# Patient Record
Sex: Female | Born: 1985 | Race: Black or African American | Hispanic: No | Marital: Single | Smoking: Current some day smoker
Health system: Southern US, Community
[De-identification: ages and names within clinical notes are randomized; demographics above are authoritative.]

## PROBLEM LIST (undated history)

## (undated) DIAGNOSIS — Z889 Allergy status to unspecified drugs, medicaments and biological substances status: Secondary | ICD-10-CM

## (undated) DIAGNOSIS — J45909 Unspecified asthma, uncomplicated: Secondary | ICD-10-CM

## (undated) HISTORY — PX: RECONSTRUCTION THUMB W/ TOE: SUR1092

## (undated) HISTORY — PX: OTHER SURGICAL HISTORY: SHX169

---

## 2008-01-06 ENCOUNTER — Emergency Department (HOSPITAL_COMMUNITY): Admission: EM | Admit: 2008-01-06 | Discharge: 2008-01-06 | Payer: Self-pay | Admitting: Emergency Medicine

## 2008-02-28 ENCOUNTER — Emergency Department (HOSPITAL_COMMUNITY): Admission: EM | Admit: 2008-02-28 | Discharge: 2008-02-28 | Payer: Self-pay | Admitting: Emergency Medicine

## 2012-06-24 ENCOUNTER — Emergency Department (HOSPITAL_BASED_OUTPATIENT_CLINIC_OR_DEPARTMENT_OTHER): Payer: Self-pay

## 2012-06-24 ENCOUNTER — Emergency Department (HOSPITAL_BASED_OUTPATIENT_CLINIC_OR_DEPARTMENT_OTHER)
Admission: EM | Admit: 2012-06-24 | Discharge: 2012-06-24 | Discharge status: Against Medical Advice | Payer: Self-pay | Attending: Emergency Medicine | Admitting: Emergency Medicine

## 2012-06-24 ENCOUNTER — Encounter (HOSPITAL_BASED_OUTPATIENT_CLINIC_OR_DEPARTMENT_OTHER): Payer: Self-pay | Admitting: *Deleted

## 2012-06-24 ENCOUNTER — Emergency Department (HOSPITAL_BASED_OUTPATIENT_CLINIC_OR_DEPARTMENT_OTHER)
Admission: EM | Admit: 2012-06-24 | Discharge: 2012-06-24 | Disposition: A | Discharge status: Home / Self Care | Payer: Self-pay | Attending: Emergency Medicine | Admitting: Emergency Medicine

## 2012-06-24 DIAGNOSIS — Z79899 Other long term (current) drug therapy: Secondary | ICD-10-CM | POA: Insufficient documentation

## 2012-06-24 DIAGNOSIS — J45909 Unspecified asthma, uncomplicated: Secondary | ICD-10-CM | POA: Insufficient documentation

## 2012-06-24 DIAGNOSIS — S6390XA Sprain of unspecified part of unspecified wrist and hand, initial encounter: Secondary | ICD-10-CM | POA: Insufficient documentation

## 2012-06-24 DIAGNOSIS — F17200 Nicotine dependence, unspecified, uncomplicated: Secondary | ICD-10-CM | POA: Insufficient documentation

## 2012-06-24 DIAGNOSIS — M79609 Pain in unspecified limb: Secondary | ICD-10-CM | POA: Insufficient documentation

## 2012-06-24 DIAGNOSIS — Y939 Activity, unspecified: Secondary | ICD-10-CM | POA: Insufficient documentation

## 2012-06-24 DIAGNOSIS — Y929 Unspecified place or not applicable: Secondary | ICD-10-CM | POA: Insufficient documentation

## 2012-06-24 DIAGNOSIS — X58XXXA Exposure to other specified factors, initial encounter: Secondary | ICD-10-CM | POA: Insufficient documentation

## 2012-06-24 HISTORY — DX: Allergy status to unspecified drugs, medicaments and biological substances: Z88.9

## 2012-06-24 HISTORY — DX: Unspecified asthma, uncomplicated: J45.909

## 2012-06-24 MED ORDER — ASPIRIN 81 MG PO CHEW: 81.0000 mg | CHEWABLE_TABLET | Freq: Every day | ORAL | Status: AC

## 2012-06-24 MED ORDER — IBUPROFEN 200 MG PO TABS
600.0000 mg | ORAL_TABLET | Freq: Once | ORAL | Status: AC
  Administered 2012-06-24: 600 mg via ORAL
  Filled 2012-06-24: qty 1

## 2012-06-24 MED ORDER — IBUPROFEN 400 MG PO TABS: 400.0000 mg | ORAL_TABLET | Freq: Four times a day (QID) | ORAL | Status: AC | PRN

## 2012-06-24 MED ORDER — ACETAMINOPHEN-CODEINE #2 300-15 MG PO TABS: 1.0000 | ORAL_TABLET | ORAL | Status: AC | PRN

## 2012-06-24 NOTE — ED Notes (Signed)
Left middle finger pain x 1 day- denies injury

## 2012-06-24 NOTE — ED Notes (Signed)
Pt approached nursing station and sts she must leave since she is "still on the clock at work and they need me to come back"

## 2012-06-24 NOTE — ED Notes (Signed)
Pt report her left middle finger hurts when she tries to straighten it since waking this morning- denies known injury

## 2012-06-24 NOTE — ED Provider Notes (Signed)
History    This chart was scribed for Danielle Kaplan, MD by Donne Anon, ED Scribe. This patient was seen in room MH06/MH06 and the patient's care was started at 1717.   CSN: [id number]  Arrival date & time 06/24/12  1707   First MD Initiated Contact with Patient 06/24/12 1717      Chief Complaint  Patient presents with  . Hand Pain     The history is provided by the patient. No language interpreter was used.   Danielle Glover is a 27 y.o. female who presents to the Emergency Department complaining of gradual onset, gradually worsening, constant moderate left middle finger pain which began when she woke up this morning. She reports that the pain is worse with movement. She reports associated swelling and difficulty moving her finger. She denies any recent trauma or any other pain. She reports this afternoon her fingertips went blue in her left hand, but it only lasted a few minutes and has not happened since.   Past Medical History  Diagnosis Date  . Asthma   . Multiple allergies     History reviewed. No pertinent past surgical history.  No family history on file.  History  Substance Use Topics  . Smoking status: Current Some Day Smoker    Types: Cigars  . Smokeless tobacco: Never Used  . Alcohol Use: No    OB History   Grav Para Term Preterm Abortions TAB SAB Ect Mult Living                  Review of Systems  Constitutional: Negative for fever.  Musculoskeletal: Positive for joint swelling and arthralgias.  All other systems reviewed and are negative.    Allergies  Review of patient's allergies indicates no known allergies.  Home Medications   Current Outpatient Rx  Name  Route  Sig  Dispense  Refill  . albuterol (PROVENTIL HFA;VENTOLIN HFA) 108 (90 BASE) MCG/ACT inhaler   Inhalation   Inhale 2 puffs into the lungs every 6 (six) hours as needed for wheezing.         . Diphenhydramine-PE-APAP (ALLERGY & SINUS INTENSE ST PO)   Oral   Take by mouth.         . fluticasone (FLOVENT HFA) 110 MCG/ACT inhaler   Inhalation   Inhale 1 puff into the lungs 2 (two) times daily.           BP 135/87  Pulse 93  Temp(Src) 99.9 F (37.7 C) (Oral)  Resp 18  SpO2 100%  Physical Exam  Nursing note and vitals reviewed. Constitutional: She is oriented to person, place, and time. She appears well-developed and well-nourished. No distress.  HENT:  Head: Normocephalic and atraumatic.  Eyes: EOM are normal.  Neck: Neck supple. No tracheal deviation present.  Cardiovascular: Normal rate, regular rhythm and normal heart sounds.   Pulmonary/Chest: Effort normal and breath sounds normal. No respiratory distress.  Musculoskeletal: Normal range of motion.  Grossly no swelling appreciated except long finger. Mild swelling on left side compared to right side. Tenderness over entire digit, worse over PIP joint.   Neurological: She is alert and oriented to person, place, and time.  Skin: Skin is warm and dry.  Psychiatric: She has a normal mood and affect. Her behavior is normal.    ED Course  Procedures (including critical care time) DIAGNOSTIC STUDIES: Oxygen Saturation is 100% on room air, normal by my interpretation.    COORDINATION OF CARE: 5:48 PM Discussed treatment  plan which includes left hand x-ray with pt at bedside and pt agreed to plan.     Labs Reviewed - No data to display No results found.   No diagnosis found.    MDM  I personally performed the services described in this documentation, which was scribed in my presence. The recorded information has been reviewed and is accurate.  Pt comes in with cc of hand injury. Woke up with the middle finger pain - the entire finger hurts, but  The pain is worse over the PIP. ROM is compromised due to pain. There is slight swelling noted over the PIP - otherwise, the exam is not suggestive of infection.  Pt also had this one episode of her finger tips turning purple. She has no risk  factors for embolic dz, no blood dyscrasias, and she is not a smoker. Will give ASA and patient requested to see PCP if repeat symptoms - which appear raynauds type phenomenon.          Danielle Kaplan, MD 06/24/12 [phone]

## 2014-04-05 IMAGING — CR DG FINGER MIDDLE 2+V*L*
3 series · 3 of 3 positions shown · non-contrast
Comparison: None

CLINICAL DATA: Left middle finger pain.

LEFT MIDDLE FINGER 2+V

[x finger pa left]
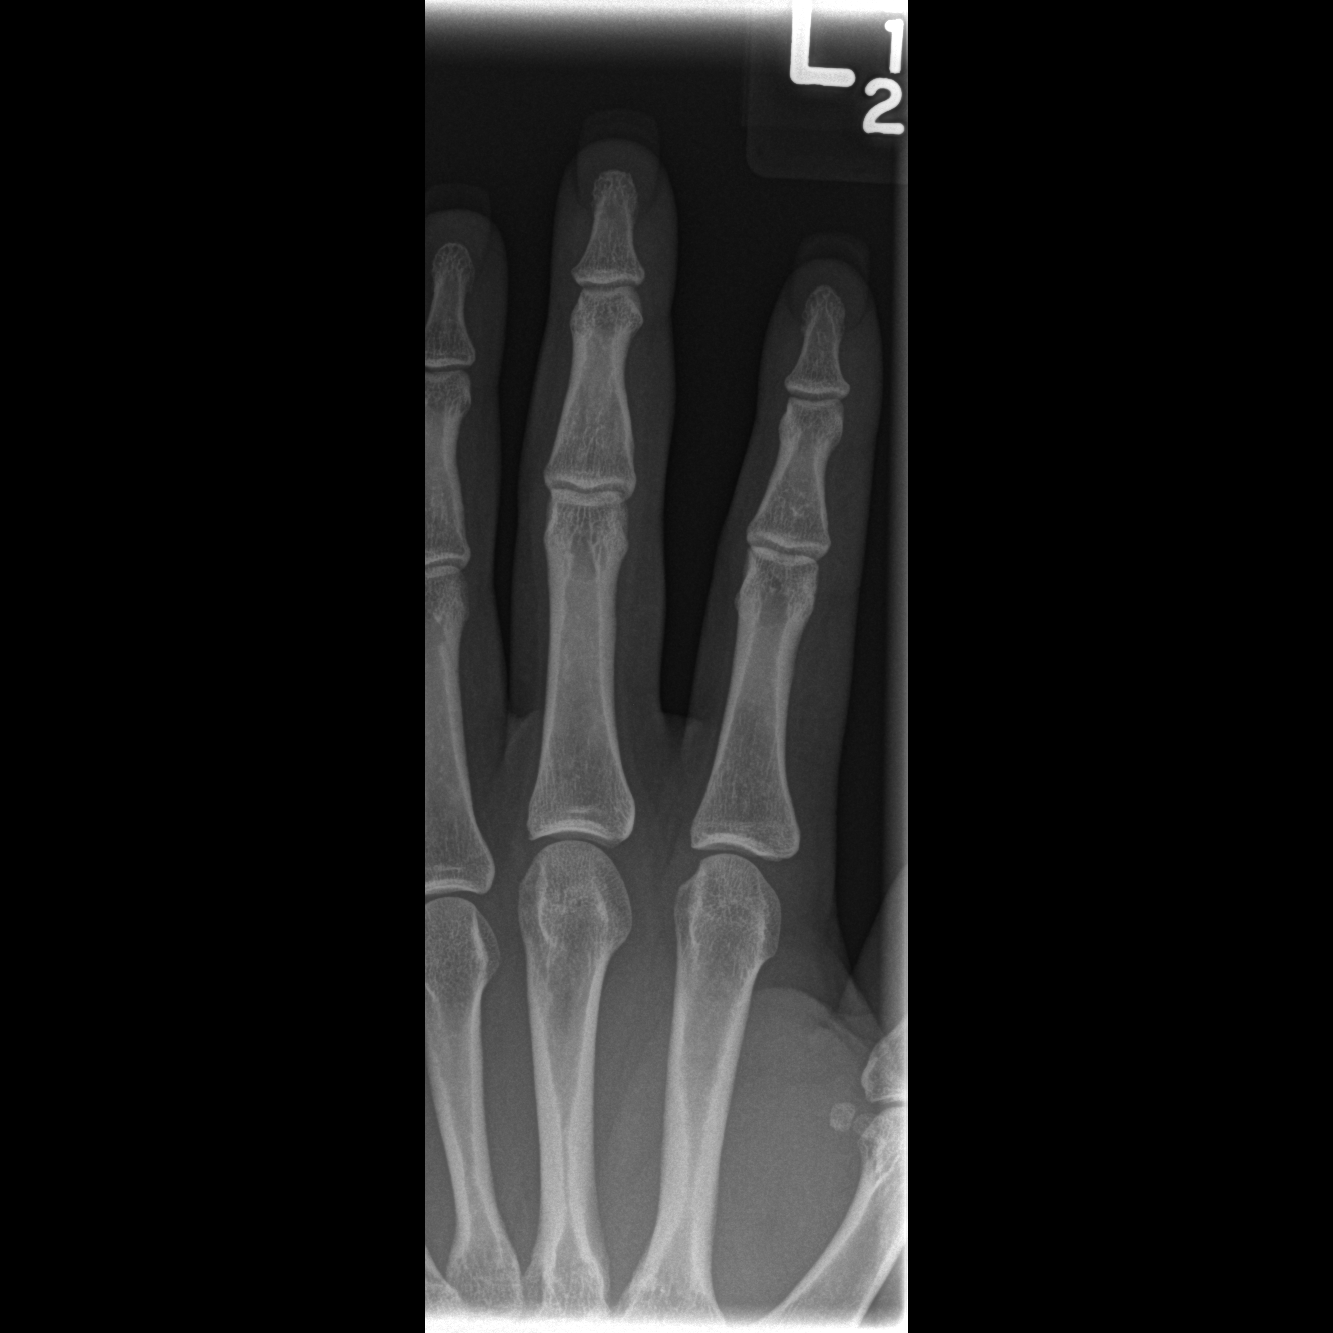

[x finger obl. left]
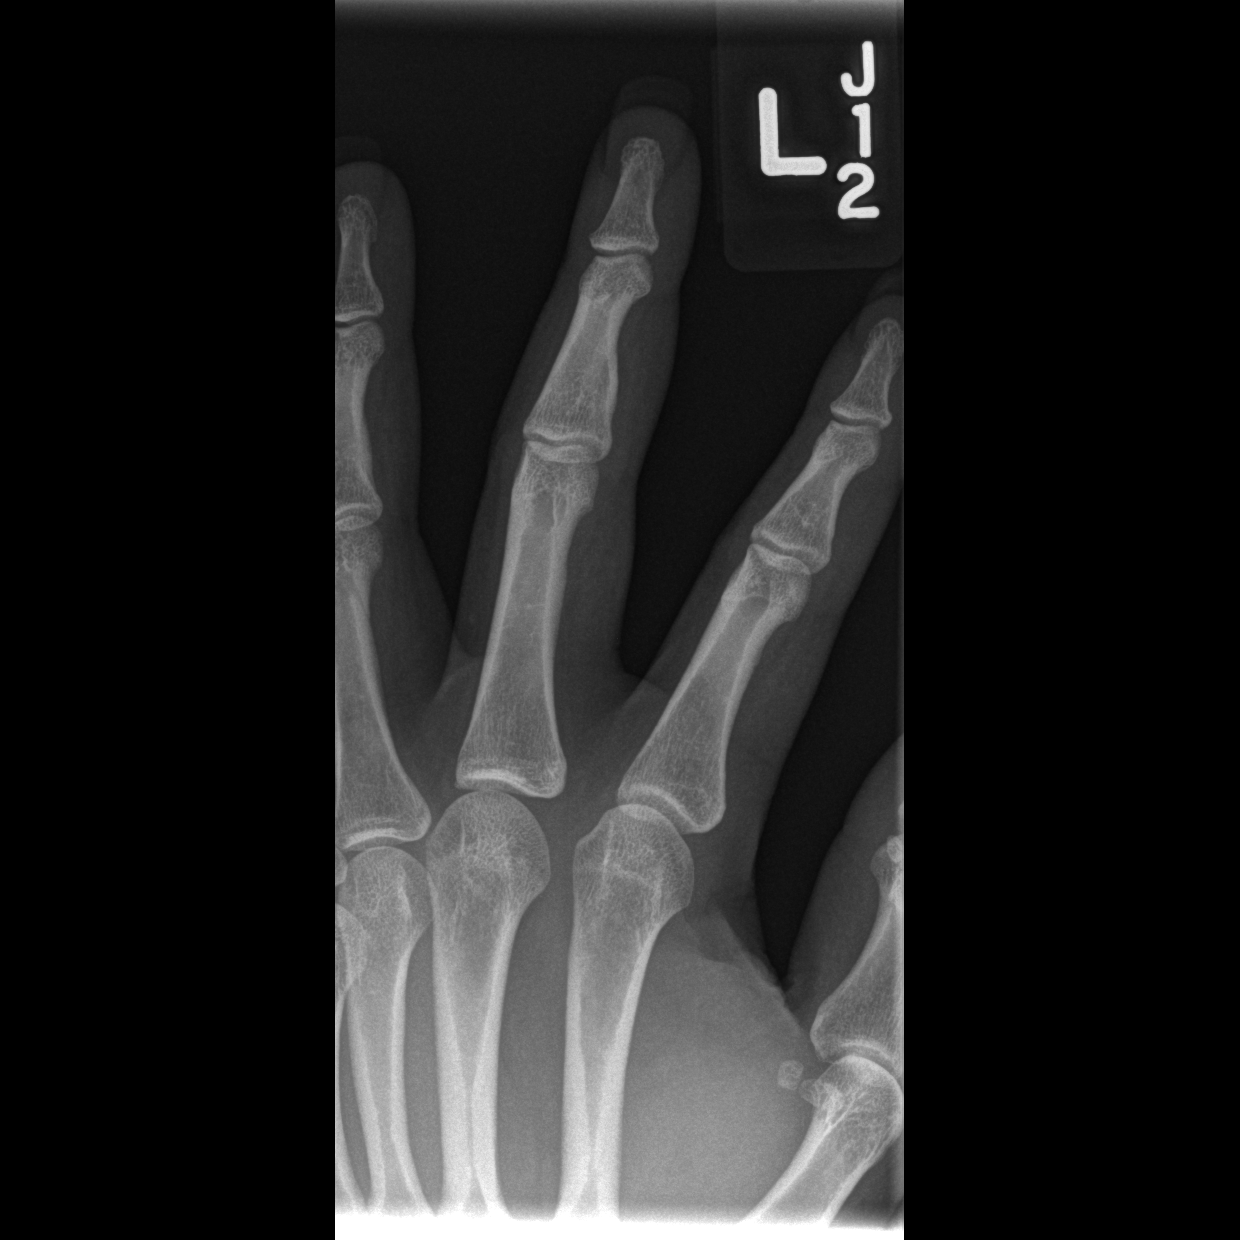

[x finger lateral left]
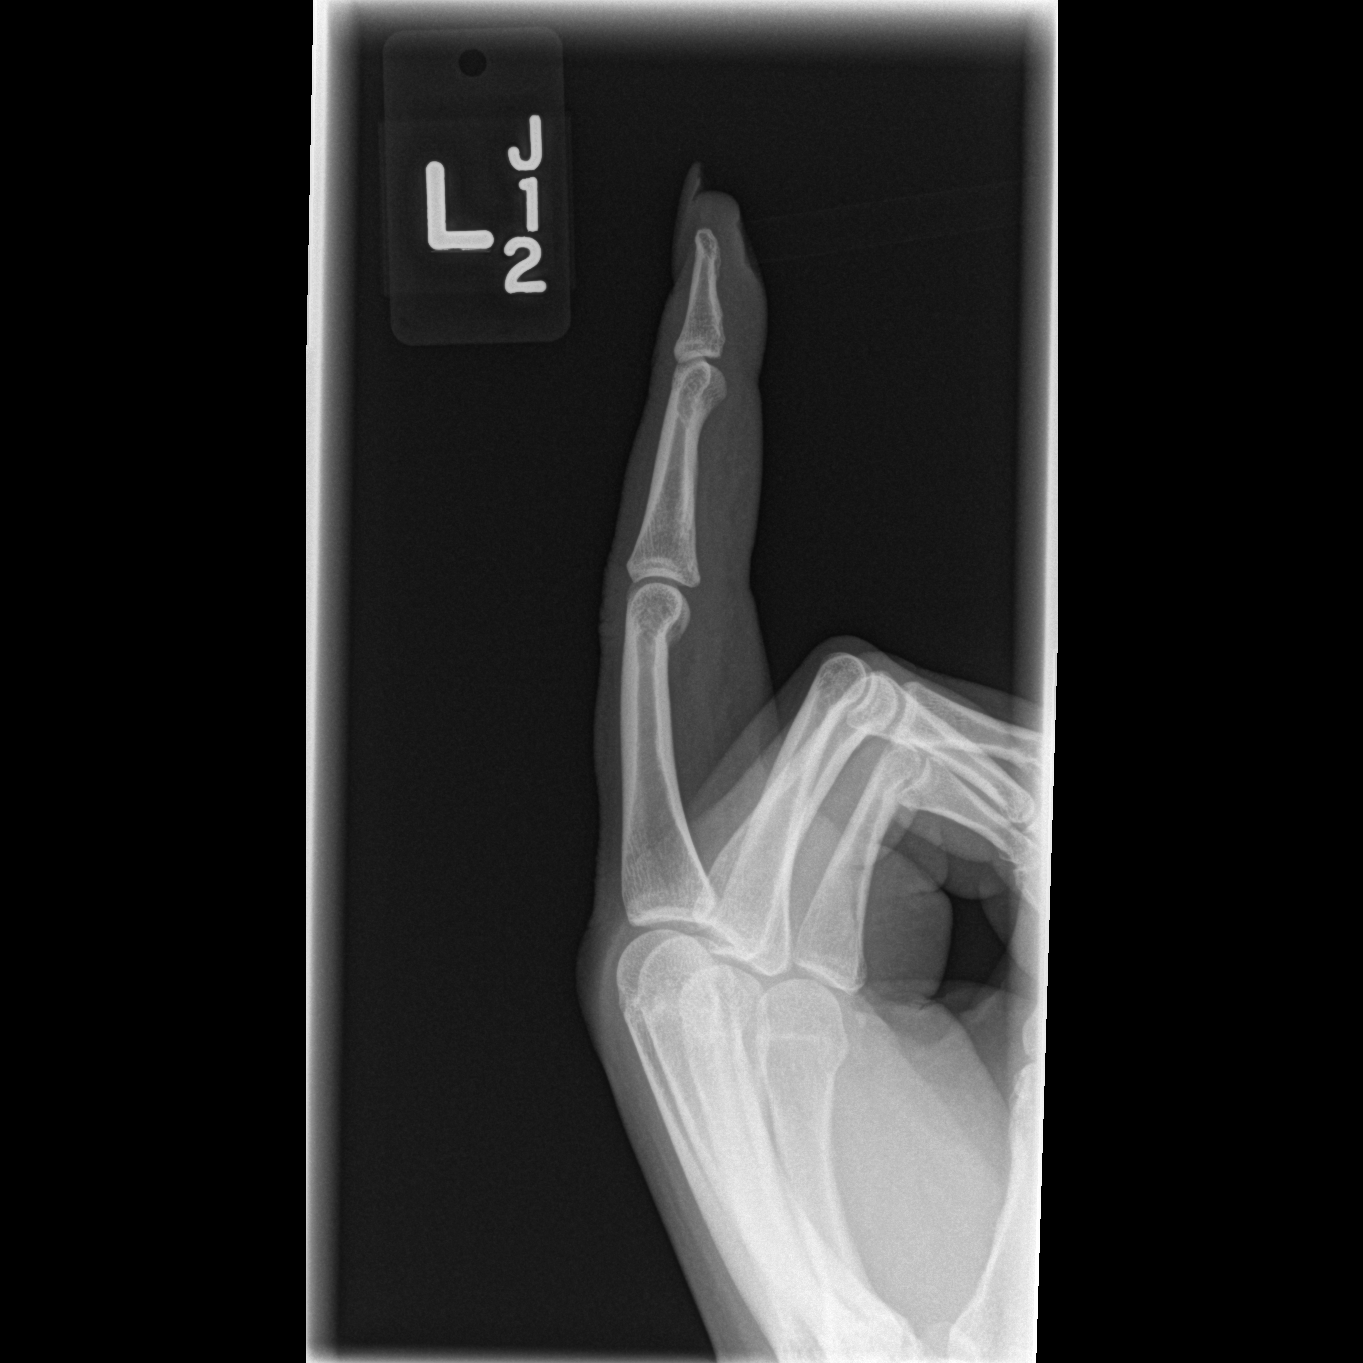

[3 of 3 positions shown; findings below may reference images not displayed]

FINDINGS: No evidence of acute fracture, subluxation or dislocation
identified.

No radio-opaque foreign bodies are present.

No focal bony lesions are noted.

The joint spaces are unremarkable.
IMPRESSION: Unremarkable exam.

## 2017-09-30 ENCOUNTER — Encounter (HOSPITAL_BASED_OUTPATIENT_CLINIC_OR_DEPARTMENT_OTHER): Payer: Self-pay | Admitting: Emergency Medicine

## 2017-09-30 ENCOUNTER — Emergency Department (HOSPITAL_BASED_OUTPATIENT_CLINIC_OR_DEPARTMENT_OTHER)
Admission: EM | Admit: 2017-09-30 | Discharge: 2017-09-30 | Disposition: A | Discharge status: Home / Self Care | Payer: 59 | Attending: Emergency Medicine | Admitting: Emergency Medicine

## 2017-09-30 ENCOUNTER — Other Ambulatory Visit: Payer: Self-pay

## 2017-09-30 DIAGNOSIS — M5489 Other dorsalgia: Secondary | ICD-10-CM | POA: Diagnosis not present

## 2017-09-30 DIAGNOSIS — Z5321 Procedure and treatment not carried out due to patient leaving prior to being seen by health care provider: Secondary | ICD-10-CM | POA: Diagnosis not present

## 2017-09-30 NOTE — ED Triage Notes (Addendum)
Pt reports upper L sided back "tightness" since 1900. Pt states pain makes it difficult for her to move or get comfortable. S/p surgery this past Tuesday. Pt states pain is worse with inspiration, but she feels "it is a muscle." She reports pain is worse with movement.

## 2017-10-01 NOTE — ED Notes (Signed)
Follow up call made  Pt states pain went away   1140  10/01/17  s Denny Mccree rn

## 2023-10-23 ENCOUNTER — Emergency Department (HOSPITAL_COMMUNITY)
Admission: EM | Admit: 2023-10-23 | Discharge: 2023-10-23 | Disposition: A | Discharge status: Home / Self Care | Attending: Emergency Medicine | Admitting: Emergency Medicine

## 2023-10-23 ENCOUNTER — Emergency Department (HOSPITAL_COMMUNITY)

## 2023-10-23 ENCOUNTER — Other Ambulatory Visit: Payer: Self-pay

## 2023-10-23 ENCOUNTER — Encounter (HOSPITAL_COMMUNITY): Payer: Self-pay

## 2023-10-23 DIAGNOSIS — J45909 Unspecified asthma, uncomplicated: Secondary | ICD-10-CM | POA: Diagnosis not present

## 2023-10-23 DIAGNOSIS — Z79899 Other long term (current) drug therapy: Secondary | ICD-10-CM | POA: Insufficient documentation

## 2023-10-23 DIAGNOSIS — R519 Headache, unspecified: Secondary | ICD-10-CM

## 2023-10-23 DIAGNOSIS — Z7982 Long term (current) use of aspirin: Secondary | ICD-10-CM | POA: Diagnosis not present

## 2023-10-23 DIAGNOSIS — I10 Essential (primary) hypertension: Secondary | ICD-10-CM | POA: Diagnosis not present

## 2023-10-23 DIAGNOSIS — R030 Elevated blood-pressure reading, without diagnosis of hypertension: Secondary | ICD-10-CM

## 2023-10-23 LAB — CBC WITH DIFFERENTIAL/PLATELET
Abs Immature Granulocytes: 0.02 10*3/uL (ref 0.00–0.07)
Basophils Absolute: 0 10*3/uL (ref 0.0–0.1)
Basophils Relative: 1 %
Eosinophils Absolute: 0.2 10*3/uL (ref 0.0–0.5)
Eosinophils Relative: 3 %
HCT: 42.8 % (ref 36.0–46.0)
Hemoglobin: 14.2 g/dL (ref 12.0–15.0)
Immature Granulocytes: 0 %
Lymphocytes Relative: 39 %
Lymphs Abs: 3.3 10*3/uL (ref 0.7–4.0)
MCH: 29.9 pg (ref 26.0–34.0)
MCHC: 33.2 g/dL (ref 30.0–36.0)
MCV: 90.1 fL (ref 80.0–100.0)
Monocytes Absolute: 0.6 10*3/uL (ref 0.1–1.0)
Monocytes Relative: 7 %
Neutro Abs: 4.2 10*3/uL (ref 1.7–7.7)
Neutrophils Relative %: 50 %
Platelets: 259 10*3/uL (ref 150–400)
RBC: 4.75 MIL/uL (ref 3.87–5.11)
RDW: 13.8 % (ref 11.5–15.5)
WBC: 8.5 10*3/uL (ref 4.0–10.5)
nRBC: 0 % (ref 0.0–0.2)

## 2023-10-23 LAB — BASIC METABOLIC PANEL WITH GFR
Anion gap: 11 (ref 5–15)
BUN: 10 mg/dL (ref 6–20)
CO2: 22 mmol/L (ref 22–32)
Calcium: 9.5 mg/dL (ref 8.9–10.3)
Chloride: 105 mmol/L (ref 98–111)
Creatinine, Ser: 0.83 mg/dL (ref 0.44–1.00)
GFR, Estimated: >60 mL/min (ref >60)
Glucose, Bld: 93 mg/dL (ref 70–99)
Potassium: 3.5 mmol/L (ref 3.5–5.1)
Sodium: 138 mmol/L (ref 135–145)

## 2023-10-23 LAB — HCG, SERUM, QUALITATIVE: Preg, Serum: NEGATIVE

## 2023-10-23 MED ORDER — AMLODIPINE BESYLATE 5 MG PO TABS
5.0000 mg | ORAL_TABLET | Freq: Once | ORAL | Status: AC
  Administered 2023-10-23: 5 mg via ORAL
  Filled 2023-10-23: qty 1

## 2023-10-23 MED ORDER — DEXAMETHASONE SODIUM PHOSPHATE 10 MG/ML IJ SOLN
10.0000 mg | Freq: Once | INTRAMUSCULAR | Status: AC
  Administered 2023-10-23: 10 mg via INTRAVENOUS
  Filled 2023-10-23: qty 1

## 2023-10-23 MED ORDER — AMLODIPINE BESYLATE 5 MG PO TABS: 5.0000 mg | ORAL_TABLET | Freq: Every day | ORAL | 0 refills | Status: AC

## 2023-10-23 MED ORDER — SODIUM CHLORIDE 0.9 % IV BOLUS
1000.0000 mL | Freq: Once | INTRAVENOUS | Status: AC
  Administered 2023-10-23: 1000 mL via INTRAVENOUS

## 2023-10-23 MED ORDER — KETOROLAC TROMETHAMINE 15 MG/ML IJ SOLN
15.0000 mg | Freq: Once | INTRAMUSCULAR | Status: AC
  Administered 2023-10-23: 15 mg via INTRAVENOUS
  Filled 2023-10-23: qty 1

## 2023-10-23 MED ORDER — PROCHLORPERAZINE EDISYLATE 10 MG/2ML IJ SOLN
10.0000 mg | Freq: Once | INTRAMUSCULAR | Status: AC
  Administered 2023-10-23: 10 mg via INTRAVENOUS
  Filled 2023-10-23: qty 2

## 2023-10-23 MED ORDER — DIPHENHYDRAMINE HCL 50 MG/ML IJ SOLN
12.5000 mg | Freq: Once | INTRAMUSCULAR | Status: AC
  Administered 2023-10-23: 12.5 mg via INTRAVENOUS
  Filled 2023-10-23: qty 1

## 2023-10-23 NOTE — Discharge Instructions (Signed)
 As we discussed, your workup in the ER today was reassuring for acute findings.  Laboratory evaluation and CT imaging did not reveal any emergent cause of your symptoms.  Given you are feeling better after the medications provided to you, no further evaluation is indicated.  Your blood pressure was notably elevated today, this could be related to the pain you have been having as well as the whitecoat hypertension you have been diagnosed with previously.  However upon further discussion you would prefer to start a blood pressure medication which I have given you the first dose of today and have prescribed for you as well.  Please be careful with taking this medicine.  I recommend that you check your blood pressure before taking it and if your blood pressure is 120/80 or below then you likely do not need it. Monitor to ensure your blood pressure is not dropping too low after taking this medication as well.  Please closely document your readings throughout the day and bring this documentation to your PCP at your next appointment.  Please discuss with them whether long-term blood pressure medication is indicated for you.  Return if development of any new or worsening symptoms.

## 2023-10-23 NOTE — ED Provider Notes (Signed)
 [place] EMERGENCY DEPARTMENT AT [facility] Provider Note   CSN: [id number] Arrival date & time: 10/23/23  1314     Patient presents with: Hypertension   Danielle Glover is a 38 y.o. female.   Patient with history of asthma, migraines presents today with complaints of headache. She states that yesterday she had an episode of lightheadedness, was able to eat dinner and feel better. Today she had a feeling of pressure in her head when she woke up, had an episode where she felt dizzy and like she would pass out. She did not loose consciousness. Went to urgent care and was told her blood pressure was elevated and to come here for evaluation.  She states that last year and following with her PCP, she was found to have elevated blood pressures in the office.  She was prescribed a blood pressure medication and told to check her readings at home.  She notes that she bought a cuff and for 1 month to check her readings 3 times a day.  She reports that she reached out to her PCP with these readings and was told that she likely had white coat hypertension and that antihypertensive medication was not required.  Since then, she has checked her blood pressure intermittently and notes that she has had readings over 120/80 and only occasionally over 140 systolic. Today her blood pressure was 180 systolic which she reports she has never seen before.  Currently she denies any vision changes, dizziness, or lightheadedness.  Does report that she has a headache particularly behind her right eye.  This came on gradually throughout the day today.  Does report she has a history of migraines but states this headache is different.  Denies any fevers, chills, chest pain, shortness of breath, nausea, vomiting, or diarrhea.  No abdominal pain.  No leg pain or leg swelling.  No recent travel or recent surgeries.  The history is provided by the patient. No language interpreter was used.  Hypertension Associated  symptoms include headaches.       Prior to Admission medications   Medication Sig Start Date End Date Taking? Authorizing Provider  acetaminophen -codeine  (TYLENOL  #2) 300-15 MG per tablet Take 1 tablet by mouth every 4 (four) hours as needed for pain. 06/24/12   Nanavati, Ankit, MD  albuterol (PROVENTIL HFA;VENTOLIN HFA) 108 (90 BASE) MCG/ACT inhaler Inhale 2 puffs into the lungs every 6 (six) hours as needed for wheezing.    [provider]  aspirin  81 MG chewable tablet Chew 1 tablet (81 mg total) by mouth daily. 06/24/12   Charlyn Sora, MD  Diphenhydramine-PE-APAP (ALLERGY & SINUS INTENSE ST PO) Take by mouth.    [provider]  fluticasone (FLOVENT HFA) 110 MCG/ACT inhaler Inhale 1 puff into the lungs 2 (two) times daily.    [provider]  ibuprofen  (ADVIL ,MOTRIN ) 400 MG tablet Take 1 tablet (400 mg total) by mouth every 6 (six) hours as needed for pain. 06/24/12   Charlyn Sora, MD    Allergies: Dust mite extract, Mixed ragweed, and Pollen extract    Review of Systems  Neurological:  Positive for headaches.  All other systems reviewed and are negative.   Updated Vital Signs BP (!) 181/110 (BP Location: Right Arm)   Pulse 67   Temp 98.7 F (37.1 C) (Oral)   Resp 17   Ht 5' 3 (1.6 m)   Wt 93 kg   SpO2 100%   BMI 36.31 kg/m   Physical Exam Vitals  and nursing note reviewed.  Constitutional:      General: She is not in acute distress.    Appearance: Normal appearance. She is normal weight. She is not ill-appearing, toxic-appearing or diaphoretic.  HENT:     Head: Normocephalic and atraumatic.   Eyes:     Extraocular Movements: Extraocular movements intact.     Pupils: Pupils are equal, round, and reactive to light.   Neck:     Comments: No meningismus Cardiovascular:     Rate and Rhythm: Normal rate and regular rhythm.     Heart sounds: Normal heart sounds.  Pulmonary:     Effort: Pulmonary effort is normal. No respiratory distress.      Breath sounds: Normal breath sounds.  Abdominal:     General: Abdomen is flat.     Palpations: Abdomen is soft.     Tenderness: There is no abdominal tenderness.   Musculoskeletal:        General: Normal range of motion.     Cervical back: Normal range of motion and neck supple.     Right lower leg: No edema.     Left lower leg: No edema.   Skin:    General: Skin is warm and dry.   Neurological:     General: No focal deficit present.     Mental Status: She is alert and oriented to person, place, and time.     GCS: GCS eye subscore is 4. GCS verbal subscore is 5. GCS motor subscore is 6.     Sensory: Sensation is intact.     Motor: Motor function is intact.     Coordination: Coordination is intact.     Gait: Gait is intact.     Comments: Alert and oriented to self, place, time and event.    Speech is fluent, clear without dysarthria or dysphasia.    Strength 5/5 in upper/lower extremities   Sensation intact in upper/lower extremities    CN I not tested  CN II grossly intact visual fields bilaterally. Did not visualize posterior eye.  CN III, IV, VI PERRLA and EOMs intact bilaterally  CN V Intact sensation to sharp and light touch to the face  CN VII facial movements symmetric  CN VIII not tested  CN IX, X no uvula deviation, symmetric rise of soft palate  CN XI 5/5 SCM and trapezius strength bilaterally  CN XII Midline tongue protrusion, symmetric L/R movements   Psychiatric:        Mood and Affect: Mood normal.        Behavior: Behavior normal.     (all labs ordered are listed, but only abnormal results are displayed) Labs Reviewed  CBC WITH DIFFERENTIAL/PLATELET  BASIC METABOLIC PANEL WITH GFR  HCG, SERUM, QUALITATIVE    EKG: None  Radiology: CT Head Wo Contrast Result Date: 10/23/2023 CLINICAL DATA:  Benign intracranial hypertension. EXAM: CT HEAD WITHOUT CONTRAST TECHNIQUE: Contiguous axial images were obtained from the base of the skull through the  vertex without intravenous contrast. RADIATION DOSE REDUCTION: This exam was performed according to the departmental dose-optimization program which includes automated exposure control, adjustment of the mA and/or kV according to patient size and/or use of iterative reconstruction technique. COMPARISON:  None Available. FINDINGS: Brain: No acute intracranial hemorrhage. No CT evidence of acute infarct. No edema, mass effect, or midline shift. The basilar cisterns are patent. Ventricles: The ventricles are normal. Vascular: No hyperdense vessel or unexpected calcification. Skull: No acute or aggressive finding. Orbits: Orbits are  symmetric. Sinuses: The visualized paranasal sinuses are clear. Other: Mastoid air cells are clear. IMPRESSION: No CT evidence of acute intracranial abnormality. Electronically Signed   By: Donnice Mania M.D.   On: 10/23/2023 14:57     Procedures   Medications Ordered in the ED  prochlorperazine (COMPAZINE) injection 10 mg (has no administration in time range)  diphenhydrAMINE (BENADRYL) injection 12.5 mg (has no administration in time range)  dexamethasone (DECADRON) injection 10 mg (has no administration in time range)  ketorolac (TORADOL) 15 MG/ML injection 15 mg (has no administration in time range)  sodium chloride 0.9 % bolus 1,000 mL (has no administration in time range)                                    Medical Decision Making Amount and/or Complexity of Data Reviewed Labs: ordered. Radiology: ordered.  Risk Prescription drug management.   This patient is a 38 y.o. female who presents to the ED for concern of headache, hypertension, this involves an extensive number of treatment options, and is a complaint that carries with it a high risk of complications and morbidity. The emergent differential diagnosis prior to evaluation includes, but is not limited to,  Stroke, increased ICP, meningitis, CVA, intracranial tumor, venous sinus thrombosis, migraine,  cluster headache, hypertension, drug related, head injury, tension headache, sinusitis, dental abscess, otitis media, TMJ, glaucoma, trigeminal neuralgia.  This is not an exhaustive differential.   Past Medical History / Co-morbidities / Social History:  has a past medical history of Asthma and Multiple allergies.  Additional history: Chart reviewed. Pertinent results include: sent from UC with concern for hypertension and headache  Physical Exam: Physical exam performed. The pertinent findings include: Very well-appearing, no acute physical exam abnormalities.  Patient alert and oriented and neurologically intact without focal deficits.  Lab Tests: I ordered, and personally interpreted labs.  The pertinent results include:  no acute laboratory abnormalities   Imaging Studies: I ordered imaging studies including CT head. I independently visualized and interpreted imaging which showed no acute findings. I agree with the radiologist interpretation.   Medications: I ordered medication including compazine, benadryl, decadron, toradol, fluids  for headache. Amlodipine for hypertension. Reevaluation of the patient after these medicines showed that the patient improved. I have reviewed the patients home medicines and have made adjustments as needed.   Disposition: After consideration of the diagnostic results and the patients response to treatment, I feel that emergency department workup does not suggest an emergent condition requiring admission or immediate intervention beyond what has been performed at this time. The plan is: Discharge with close outpatient follow-up and return precautions.  After headache cocktail, patient's headache has substantially improved.  Her blood pressure is still elevated, last check 170/109.  Shared decision making, did discuss with her that given she has been diagnosed with whitecoat hypertension and has been in pain all day, could be contributing to her hypertension.   Could also have untreated hypertension given she does not routinely check her blood pressure and has had some readings in the 140s previously. She would like to be treated for her hypertension.  Will prescribe amlodipine.  Emphasized importance of taking her blood pressure regularly.  Discussed with her that if her blood pressure is within normal limits prior to taking the medicine then likely she does not need it at home.  Also recommended that if her blood pressure drops low after taking  it then again she may not need it.  Recommend she document her reads for her PCP to discuss long-term management at her next appointment.  Evaluation and diagnostic testing in the emergency department does not suggest an emergent condition requiring admission or immediate intervention beyond what has been performed at this time.  Plan for discharge with close PCP follow-up.  Patient is understanding and amenable with plan, educated on red flag symptoms that would prompt immediate return.  Patient discharged in stable condition.  Findings and plan of care discussed with supervising physician Dr. Franklyn who is in agreement.   Final diagnoses:  Bad headache  Elevated blood pressure reading    ED Discharge Orders          Ordered    amLODipine (NORVASC) 5 MG tablet  Daily        10/23/23 2127          An After Visit Summary was printed and given to the patient.      Danielle Glover 10/23/23 2201    Franklyn Sid SAILOR, MD 10/24/23 [phone]

## 2023-10-23 NOTE — ED Triage Notes (Signed)
 Reports having pressure in her head that started yesterday but worse today Reports was taken off BP meds in 2024 and hasn't had any issues with her bp.  Due to the pressure and blurred vision and feeling lightheaded she went to UC and was sent here due to her BP.  No unilateral weakness numbness, speech deficit.

## 2023-10-23 NOTE — ED Provider Triage Note (Signed)
 Emergency Medicine Provider Triage Evaluation Note  Danielle Glover , a 38 y.o. female  was evaluated in triage.  Pt complains of pressure in the head along with blurry vision, and nausea since yesterday.  She states she has been told she has had elevated blood pressure in the past but has not prescribed anything for this.  No weakness or numbness.  Review of Systems  Positive: As above Negative: As above  Physical Exam  BP (!) 187/124 (BP Location: Right Arm)   Pulse 93   Temp 98.8 F (37.1 C)   Resp 18   Ht 5' 3 (1.6 m)   Wt 93 kg   SpO2 100%   BMI 36.31 kg/m  Gen:   Awake, no distress   Resp:  Normal effort  MSK:   Moves extremities without difficulty  Other:  Cranial nerves III through XII intact.  Full range of motion bilateral upper and lower extremities with 5/5 strength in extensor and flexor muscle groups.  Tongue midline.  No pronator drift.  Medical Decision Making  Medically screening exam initiated at 2:02 PM.  Appropriate orders placed.  Loveda Colaizzi was informed that the remainder of the evaluation will be completed by another provider, this initial triage assessment does not replace that evaluation, and the importance of remaining in the ED until their evaluation is complete.     Hildegard Loge, PA-C 10/23/23 1404

## 2023-10-23 NOTE — ED Notes (Addendum)
 30 mins after pt arrived I had just came back from taking a pt back to a room pt mother I assume  came up to me stating that pt needed her vitals checked because she came from urgent care and they sent her over and nobody here has came to get her. I told her I would get her vitals however triage came out and called for her.   About 2 hours after pt had been here pt mother came up to me again stating that she needs her vitals rechecked. I informed her I will be right over after I get a couple more vitals first. Mother said ok When I went to get pt vitals she was upset making comments to her mother as I was obtaining the vitals.  Bringing the time to now I was just finishing up taking another pt vitals and pt started talking to me asking to see the provider. I informed her that you will see them when you get back in the back pt stated that's the problem I should have already been back there then pt mother responded yes she is a priority and should have already ready been back there to see the doctor. Who is it that I need to talk to get her back there because yall out here doing what yall want to do and its not right. I dont want to talk to patient care because they dont do anything. I told them I can talk to charge however she is busy at the moment.  Pt  responded no we need to do what I did last time and record and post in on social media and tag them that will get them moving. Pt mother respond no I just remember what I work for an [employer] that's what I will do because this is ridiculous.  I walked away because the conversation was no longer needing me.
